# Patient Record
Sex: Male | Born: 2000 | Race: White | Hispanic: No | Marital: Single | Smoking: Never smoker
Health system: Southern US, Community
[De-identification: ages and names within clinical notes are randomized; demographics above are authoritative.]

---
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
## 2020-02-23 ENCOUNTER — Emergency Department: Admit: 2020-02-23 | Discharge status: Absent without leave | Payer: Self-pay

## 2020-02-23 ENCOUNTER — Other Ambulatory Visit: Payer: Self-pay

## 2020-02-23 ENCOUNTER — Encounter: Payer: Self-pay | Admitting: Emergency Medicine

## 2020-02-23 ENCOUNTER — Emergency Department
Admission: EM | Admit: 2020-02-23 | Discharge: 2020-02-23 | Disposition: A | Discharge status: Home / Self Care | Payer: BC Managed Care – PPO | Source: Home / Self Care

## 2020-02-23 DIAGNOSIS — J069 Acute upper respiratory infection, unspecified: Secondary | ICD-10-CM | POA: Diagnosis not present

## 2020-02-23 DIAGNOSIS — R6889 Other general symptoms and signs: Secondary | ICD-10-CM

## 2020-02-23 DIAGNOSIS — Z9189 Other specified personal risk factors, not elsewhere classified: Secondary | ICD-10-CM

## 2020-02-23 MED ORDER — IPRATROPIUM BROMIDE 0.06 % NA SOLN: 2.0000 | Freq: Four times a day (QID) | NASAL | 1 refills | Status: AC

## 2020-02-23 MED ORDER — BENZONATATE 100 MG PO CAPS: 100.0000 mg | ORAL_CAPSULE | Freq: Three times a day (TID) | ORAL | 0 refills | Status: AC

## 2020-02-23 NOTE — ED Triage Notes (Signed)
Fever at home -102 on tues & wed COVID test last Monday was negative  Sore throat &congestion since Monday  E-visit w/ [facility] on Tuesday  NO COVID vaccine Ibuprofen at 0600 today

## 2020-02-23 NOTE — ED Provider Notes (Signed)
[facility]    CSN: [id number] Arrival date & time: 02/23/20  1334      History   Chief Complaint Chief Complaint  Patient presents with  . Sore Throat  . Fever    HPI Craig Ortega is a 19 y.o. male.   HPI Craig Ortega is a 19 y.o. male presenting to UC with c/o flu-like symptoms with fever Tmax 102*F, body aches, fatigue, cough, congestion, sore throat for 4 days.  He did an Evisit with [facility] when symptoms started. He tested negative for COVID at that time. Was prescribed Astelin nasal spray and pseudoephedrine-brompheniramine-DM cough syrup without much relief.  Denies chest pain or SOB. No n/v/d. No known sick contacts. He has not received the COVID or flu vaccine.     History reviewed. No pertinent past medical history.  Patient Active Problem List   Diagnosis Date Noted  . Closed fracture of right clavicle 02/27/2013    History reviewed. No pertinent surgical history.     Home Medications    Prior to Admission medications   Medication Sig Start Date End Date Taking? Authorizing Provider  brompheniramine-pseudoephedrine-DM 30-2-10 MG/5ML syrup Take by mouth. 02/20/20 03/01/20 Yes [provider]  escitalopram (LEXAPRO) 10 MG tablet Take by mouth. 10/06/18  Yes [provider]  benzonatate (TESSALON) 100 MG capsule Take 1-2 capsules (100-200 mg total) by mouth every 8 (eight) hours. 02/23/20   Lurene Shadow, PA-C  ipratropium (ATROVENT) 0.06 % nasal spray Place 2 sprays into both nostrils 4 (four) times daily. 02/23/20   Lurene Shadow, PA-C    Family History Family History  Problem Relation Age of Onset  . Healthy Mother   . Healthy Father   . Healthy Sister   . Healthy Brother   . Healthy Brother   . Sudden death Neg Hx   . Hypertension Neg Hx   . Hyperlipidemia Neg Hx   . Heart attack Neg Hx   . Diabetes Neg Hx     Social History Social History   Tobacco Use  . Smoking status: Never Smoker  . Smokeless tobacco:  Never Used  Vaping Use  . Vaping Use: Never used  Substance Use Topics  . Alcohol use: No  . Drug use: Never     Allergies   Patient has no known allergies.   Review of Systems Review of Systems  Constitutional: Positive for fatigue and fever. Negative for chills.  HENT: Positive for congestion, postnasal drip, rhinorrhea and sore throat. Negative for ear pain, trouble swallowing and voice change.   Respiratory: Positive for cough. Negative for shortness of breath.   Cardiovascular: Negative for chest pain and palpitations.  Gastrointestinal: Negative for abdominal pain, diarrhea, nausea and vomiting.  Musculoskeletal: Positive for arthralgias, back pain and myalgias.  Skin: Negative for rash.  All other systems reviewed and are negative.    Physical Exam Triage Vital Signs ED Triage Vitals  Enc Vitals Group     BP 02/23/20 1437 (!) 143/88     Pulse Rate 02/23/20 1437 87     Resp 02/23/20 1437 17     Temp 02/23/20 1437 100.3 F (37.9 C)     Temp Source 02/23/20 1437 Oral     SpO2 02/23/20 1437 99 %     Weight 02/23/20 1439 130 lb (59 kg)     Height 02/23/20 1439 5\' 6"  (1.676 m)     Head Circumference --      Peak Flow --  Pain Score 02/23/20 1438 6     Pain Loc --      Pain Edu? --      Excl. in GC? --    No data found.  Updated Vital Signs BP (!) 143/88 (BP Location: Right Arm)   Pulse 87   Temp 100.3 F (37.9 C) (Oral)   Resp 17   Ht 5\' 6"  (1.676 m)   Wt 130 lb (59 kg)   SpO2 99%   BMI 20.98 kg/m   Visual Acuity Right Eye Distance:   Left Eye Distance:   Bilateral Distance:    Right Eye Near:   Left Eye Near:    Bilateral Near:     Physical Exam Vitals and nursing note reviewed.  Constitutional:      General: He is not in acute distress.    Appearance: He is well-developed. He is not ill-appearing, toxic-appearing or diaphoretic.  HENT:     Head: Normocephalic and atraumatic.     Right Ear: Tympanic membrane and ear canal normal.      Left Ear: Tympanic membrane and ear canal normal.     Nose: Mucosal edema and congestion present.     Right Sinus: No maxillary sinus tenderness or frontal sinus tenderness.     Left Sinus: No maxillary sinus tenderness or frontal sinus tenderness.     Mouth/Throat:     Lips: Pink.     Mouth: Mucous membranes are moist.     Pharynx: Oropharynx is clear. Uvula midline. No pharyngeal swelling, oropharyngeal exudate, posterior oropharyngeal erythema or uvula swelling.  Cardiovascular:     Rate and Rhythm: Normal rate and regular rhythm.  Pulmonary:     Effort: Pulmonary effort is normal. No respiratory distress.     Breath sounds: Normal breath sounds. No stridor. No wheezing, rhonchi or rales.  Musculoskeletal:        General: Normal range of motion.     Cervical back: Normal range of motion and neck supple.  Lymphadenopathy:     Cervical: No cervical adenopathy.  Skin:    General: Skin is warm and dry.  Neurological:     Mental Status: He is alert and oriented to person, place, and time.  Psychiatric:        Behavior: Behavior normal.      UC Treatments / Results  Labs (all labs ordered are listed, but only abnormal results are displayed) Labs Reviewed  COVID-19, FLU A+B AND RSV    EKG   Radiology No results found.  Procedures Procedures (including critical care time)  Medications Ordered in UC Medications - No data to display  Initial Impression / Assessment and Plan / UC Course  I have reviewed the triage vital signs and the nursing notes.  Pertinent labs & imaging results that were available during my care of the patient were reviewed by me and considered in my medical decision making (see chart for details).     Flu-like symptoms started 5 days ago. Vitals: low grade temp, O2 Sat 99% on RA, HR 87 Lungs: CTAB, normal TMs, no evidence of strep pharyngitis on exam. COVID/RSV/Flu test pending Encouraged continued symptomatic tx F/u with PCP next week if  needed.  Final Clinical Impressions(s) / UC Diagnoses   Final diagnoses:  At increased risk of exposure to COVID-19 virus  Upper respiratory tract infection, unspecified type  Flu-like symptoms     Discharge Instructions      You may take 500mg  acetaminophen every 4-6 hours or in  combination with ibuprofen 400-600mg  every 6-8 hours as needed for pain, inflammation, and fever.  Be sure to well hydrated with clear liquids and get at least 8 hours of sleep at night, preferably more while sick.   Please follow up with family medicine in 1 week if needed.     ED Prescriptions    Medication Sig Dispense Auth. Provider   ipratropium (ATROVENT) 0.06 % nasal spray Place 2 sprays into both nostrils 4 (four) times daily. 15 mL Elanore Talcott O, PA-C   benzonatate (TESSALON) 100 MG capsule Take 1-2 capsules (100-200 mg total) by mouth every 8 (eight) hours. 21 capsule Lurene Shadow, [place]     PDMP not reviewed this encounter.   Lurene Shadow, [place] 02/23/20 1519

## 2020-02-23 NOTE — Discharge Instructions (Signed)
  You may take 500mg acetaminophen every 4-6 hours or in combination with ibuprofen 400-600mg every 6-8 hours as needed for pain, inflammation, and fever.  Be sure to well hydrated with clear liquids and get at least 8 hours of sleep at night, preferably more while sick.   Please follow up with family medicine in 1 week if needed.   

## 2020-02-28 LAB — COVID-19, FLU A+B AND RSV
Influenza A, NAA: DETECTED — AB
Influenza B, NAA: NOT DETECTED
RSV, NAA: NOT DETECTED
SARS-CoV-2, NAA: NOT DETECTED

## 2022-07-04 IMAGING — DX DG NASAL BONES 3+V
3 series · 3 of 3 positions shown · non-contrast
Comparison: None.

CLINICAL DATA: Pain status post soccer injury.

EXAM:
NASAL BONES - 3+ VIEW

[nasal waters]
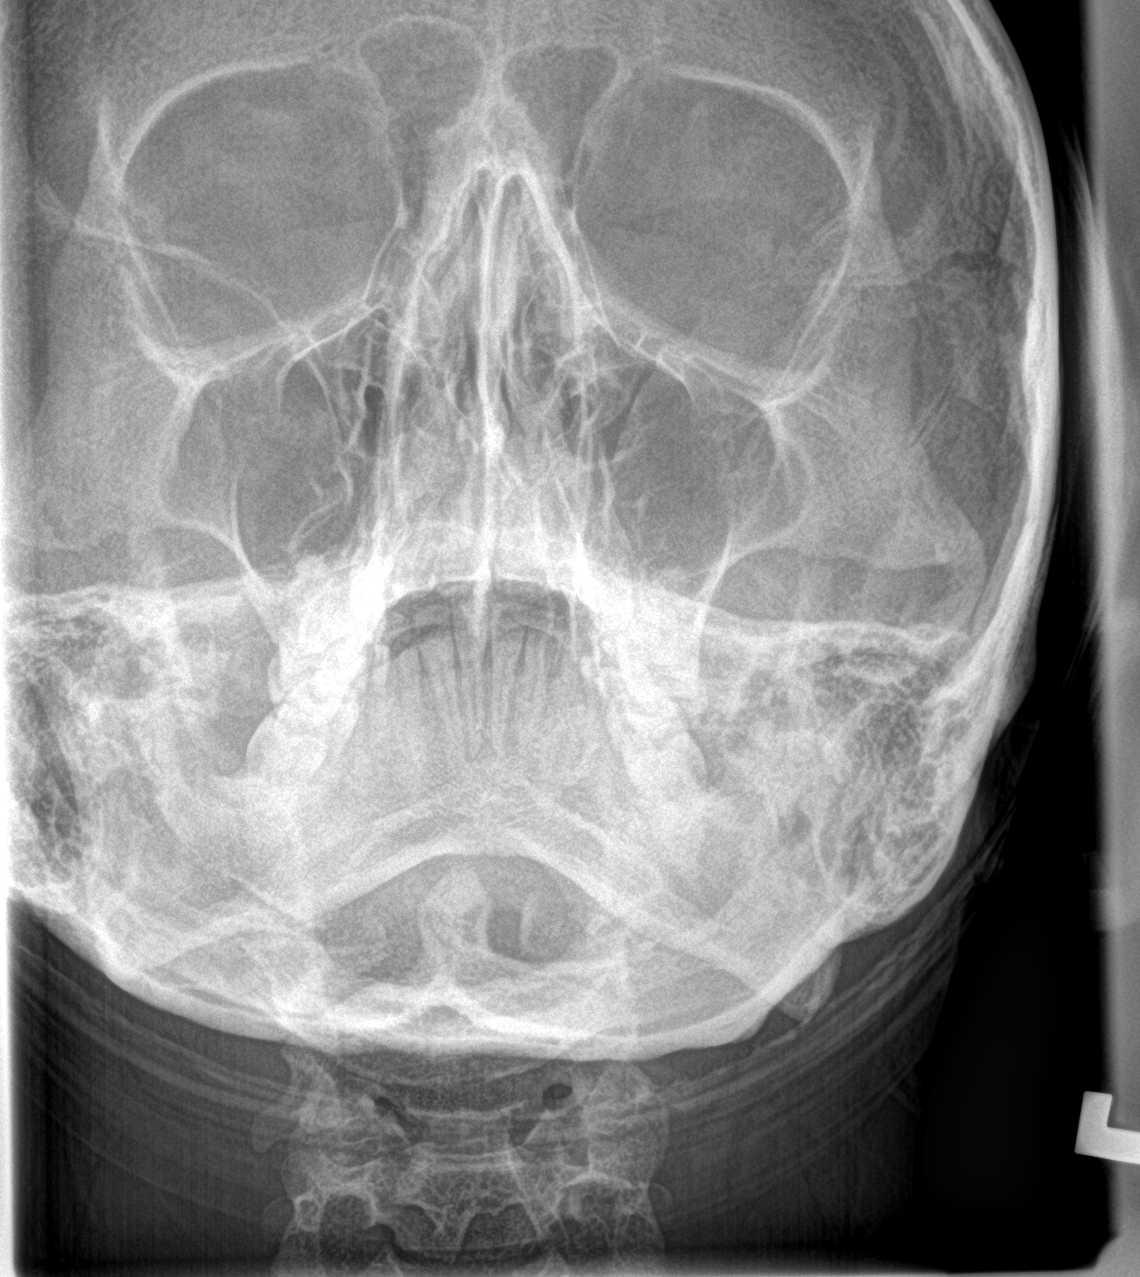

[nasal lat (1 of 2)]
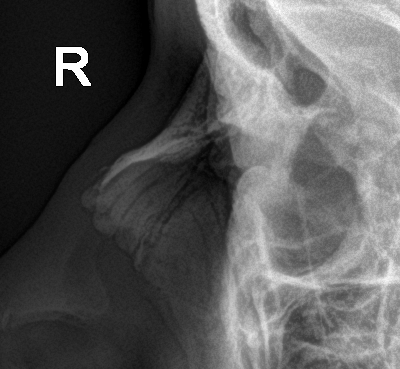

[nasal lat (2 of 2)]
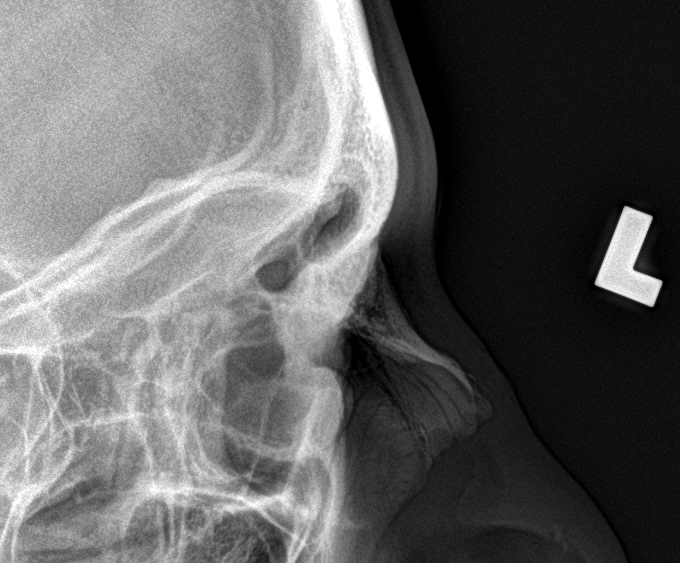

[3 of 3 positions shown; findings below may reference images not displayed]

FINDINGS: There is no evidence of fracture or other bone abnormality.
IMPRESSION: Negative.
